# Patient Record
Sex: Male | Born: 1990 | Race: White | Hispanic: No | Marital: Single | Smoking: Never smoker
Health system: Southern US, Community
[De-identification: ages and names within clinical notes are randomized; demographics above are authoritative.]

## PROBLEM LIST (undated history)

## (undated) HISTORY — PX: LUMBAR MICRODISCECTOMY: SHX99

---
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
## 2011-07-21 ENCOUNTER — Ambulatory Visit: Payer: BC Managed Care – PPO | Attending: Family Medicine | Admitting: Sleep Medicine

## 2011-07-21 DIAGNOSIS — G4710 Hypersomnia, unspecified: Secondary | ICD-10-CM | POA: Insufficient documentation

## 2011-07-28 NOTE — Procedures (Signed)
NAMEDONTARIUS, SHELEY NO.:  [id number]  MEDICAL RECORD NO.:  [id number]          PATIENT TYPE:  OUT  LOCATION:  SLEEP LAB                     FACILITY:  [facility]  PHYSICIAN:  Masyn Rostro A. Gerilyn Pilgrim, M.D. DATE OF BIRTH:  10-31-1990  DATE OF STUDY:  07/21/2011                           NOCTURNAL POLYSOMNOGRAM  REFERRING PHYSICIAN:  Scott A. Gerda Diss, MD  INDICATION:  A 21 year old who presents with hypersomnia and snoring. The study is being done to evaluate for obstructive sleep apnea syndrome.   MEDICATIONS:  None.  EPWORTH SLEEPINESS SCALE:  11.  BMI:  29.  ARCHITECTURAL SUMMARY:  The total recording time is 398 minutes.  Sleep efficiency 80%.  Sleep latency 71 minutes.  REM latency is 61 minutes. Stage N1 5%, N2 58%, N3 24% and REM sleep 13%.  RISK FACTORS SUMMARY:  Baseline oxygen saturation is 96, lowest saturation 92.  Diagnostic AHI is 1 and RDI 7.  LIMB MOVEMENT SUMMARY:  PLM index 1.5.  ELECTROCARDIOGRAM SUMMARY:  Average heart rate is 62 with no significant dysrhythmias observed.  IMPRESSION:  Unremarkable nocturnal polysomnography.  RECOMMENDATION:  Given the patient's age and unremarkable study, I would suggest sleep consultation to evaluate for other causes of hypersomnia more common in this age group; in particular to evaluate for narcolepsy and idiopathic hypersomnia.  Thank you for this referral.    Malika Demario A. Gerilyn Pilgrim, M.D.    KAD/MEDQ  D:  07/28/2011 18:07:31  T:  07/28/2011 18:24:49  Job:  [id number]

## 2019-11-23 ENCOUNTER — Encounter (HOSPITAL_COMMUNITY): Payer: Self-pay | Admitting: *Deleted

## 2019-11-23 ENCOUNTER — Other Ambulatory Visit: Payer: Self-pay

## 2019-11-23 ENCOUNTER — Emergency Department (HOSPITAL_COMMUNITY): Payer: BC Managed Care – PPO

## 2019-11-23 ENCOUNTER — Emergency Department (HOSPITAL_COMMUNITY)
Admission: EM | Admit: 2019-11-23 | Discharge: 2019-11-23 | Disposition: A | Discharge status: Home / Self Care | Payer: BC Managed Care – PPO | Attending: Emergency Medicine | Admitting: Emergency Medicine

## 2019-11-23 ENCOUNTER — Ambulatory Visit (INDEPENDENT_AMBULATORY_CARE_PROVIDER_SITE_OTHER)
Admission: EM | Admit: 2019-11-23 | Discharge: 2019-11-23 | Disposition: A | Discharge status: Home / Self Care | Payer: BC Managed Care – PPO | Source: Home / Self Care

## 2019-11-23 DIAGNOSIS — R29810 Facial weakness: Secondary | ICD-10-CM

## 2019-11-23 DIAGNOSIS — R000 Tachycardia, unspecified: Secondary | ICD-10-CM | POA: Diagnosis not present

## 2019-11-23 DIAGNOSIS — R4781 Slurred speech: Secondary | ICD-10-CM | POA: Diagnosis not present

## 2019-11-23 DIAGNOSIS — R29818 Other symptoms and signs involving the nervous system: Secondary | ICD-10-CM | POA: Diagnosis not present

## 2019-11-23 DIAGNOSIS — G510 Bell's palsy: Secondary | ICD-10-CM | POA: Diagnosis not present

## 2019-11-23 DIAGNOSIS — G529 Cranial nerve disorder, unspecified: Secondary | ICD-10-CM | POA: Diagnosis not present

## 2019-11-23 LAB — COMPREHENSIVE METABOLIC PANEL
ALT: 85 U/L — ABNORMAL HIGH (ref 0–44)
AST: 46 U/L — ABNORMAL HIGH (ref 15–41)
Albumin: 4.8 g/dL (ref 3.5–5.0)
Alkaline Phosphatase: 48 U/L (ref 38–126)
Anion gap: 7 (ref 5–15)
BUN: 11 mg/dL (ref 6–20)
CO2: 27 mmol/L (ref 22–32)
Calcium: 9.3 mg/dL (ref 8.9–10.3)
Chloride: 104 mmol/L (ref 98–111)
Creatinine, Ser: 1.23 mg/dL (ref 0.61–1.24)
GFR calc Af Amer: >60 mL/min (ref >60)
GFR calc non Af Amer: >60 mL/min (ref >60)
Glucose, Bld: 94 mg/dL (ref 70–99)
Potassium: 4.2 mmol/L (ref 3.5–5.1)
Sodium: 138 mmol/L (ref 135–145)
Total Bilirubin: 0.6 mg/dL (ref 0.3–1.2)
Total Protein: 7.7 g/dL (ref 6.5–8.1)

## 2019-11-23 LAB — APTT: aPTT: 26 seconds (ref 24–36)

## 2019-11-23 LAB — I-STAT CHEM 8, ED
BUN: 11 mg/dL (ref 6–20)
Calcium, Ion: 1.23 mmol/L (ref 1.15–1.40)
Chloride: 100 mmol/L (ref 98–111)
Creatinine, Ser: 1.3 mg/dL — ABNORMAL HIGH (ref 0.61–1.24)
Glucose, Bld: 92 mg/dL (ref 70–99)
HCT: 47 % (ref 39.0–52.0)
Hemoglobin: 16 g/dL (ref 13.0–17.0)
Potassium: 4.1 mmol/L (ref 3.5–5.1)
Sodium: 142 mmol/L (ref 135–145)
TCO2: 31 mmol/L (ref 22–32)

## 2019-11-23 LAB — CBC
HCT: 46.5 % (ref 39.0–52.0)
Hemoglobin: 15.9 g/dL (ref 13.0–17.0)
MCH: 30.9 pg (ref 26.0–34.0)
MCHC: 34.2 g/dL (ref 30.0–36.0)
MCV: 90.3 fL (ref 80.0–100.0)
Platelets: 239 10*3/uL (ref 150–400)
RBC: 5.15 MIL/uL (ref 4.22–5.81)
RDW: 12.2 % (ref 11.5–15.5)
WBC: 9.8 10*3/uL (ref 4.0–10.5)
nRBC: 0 % (ref 0.0–0.2)

## 2019-11-23 LAB — DIFFERENTIAL
Abs Immature Granulocytes: 0.04 10*3/uL (ref 0.00–0.07)
Basophils Absolute: 0.1 10*3/uL (ref 0.0–0.1)
Basophils Relative: 1 %
Eosinophils Absolute: 0.2 10*3/uL (ref 0.0–0.5)
Eosinophils Relative: 2 %
Immature Granulocytes: 0 %
Lymphocytes Relative: 32 %
Lymphs Abs: 3.2 10*3/uL (ref 0.7–4.0)
Monocytes Absolute: 1.1 10*3/uL — ABNORMAL HIGH (ref 0.1–1.0)
Monocytes Relative: 11 %
Neutro Abs: 5.3 10*3/uL (ref 1.7–7.7)
Neutrophils Relative %: 54 %

## 2019-11-23 LAB — PROTIME-INR
INR: 0.9 (ref 0.8–1.2)
Prothrombin Time: 12.2 seconds (ref 11.4–15.2)

## 2019-11-23 LAB — CBG MONITORING, ED: Glucose-Capillary: 99 mg/dL (ref 70–99)

## 2019-11-23 MED ORDER — SODIUM CHLORIDE 0.9% FLUSH: 3.0000 mL | Freq: Once | INTRAVENOUS | Status: DC

## 2019-11-23 MED ORDER — PREDNISONE 50 MG PO TABS
60.0000 mg | ORAL_TABLET | Freq: Once | ORAL | Status: AC
  Administered 2019-11-23: 60 mg via ORAL
  Filled 2019-11-23: qty 1

## 2019-11-23 MED ORDER — PREDNISONE 50 MG PO TABS
50.0000 mg | ORAL_TABLET | Freq: Every day | ORAL | 0 refills | Status: DC
Start: 2019-11-23 — End: 2021-10-04

## 2019-11-23 MED ORDER — IOHEXOL 350 MG/ML SOLN
100.0000 mL | Freq: Once | INTRAVENOUS | Status: AC | PRN
  Administered 2019-11-23: 100 mL via INTRAVENOUS

## 2019-11-23 NOTE — ED Triage Notes (Signed)
Pt with right facial droop and slurred speech since 0930 this morning, seen at urgent Care and sent here.  Pt denies any numbness or weakness, denies any HA

## 2019-11-23 NOTE — ED Triage Notes (Signed)
Facial droop and slurred speech since 9am.  Advised to go to the ER further workup.

## 2019-11-23 NOTE — Consult Note (Signed)
[facility] Tele[facility]urology Consult Services   Date of Service:   11/23/2019 14:40:13  Impression:     .  I63.9 - Cerebrovascular accident (CVA), unspecified mechanism ([place])     .  G51.0 - Bell palsy  Comments/Sign-Out: Pt is a 41 YOM with PMH oral herpes who presented with right facial weakness (UMN/LMN), and loss of taste on right side. NIHSS: 3. He was outside TPA window. Get CTA to eval for LVO/STENOSIS. Admit for MRI to r/o stroke. If workup negative for stroke then probably bells palsy and can treat with prednisone + acylovir for 7 days.  Metrics: Last Known Well: 11/23/2019 09:30:00 [facility] Notification Time: 11/23/2019 14:40:13 Arrival Time: 11/23/2019 14:12:00 Stamp Time: 11/23/2019 14:40:13 Time First Login Attempt: 11/23/2019 14:41:49 Symptoms: right facial droop NIHSS Start Assessment Time: 11/23/2019 14:45:07 Patient is not a candidate for Thrombolytic. Thrombolytic Medical Decision: 11/23/2019 15:00:11 Patient was not deemed candidate for Thrombolytic because of following reasons: Last Well Known Above 4.5 Hours.  CT head showed no acute hemorrhage or acute core infarct.  Radiologist was not called back for review of advanced imaging because CTA: Suboptimal CTA due to timing of the contrast bolus. Late arterial scanning was performed  Negative CT angiogram of the head neck. No stenosis or large vessel occlusion identified.  ED Physician notified of diagnostic impression and management plan on 11/23/2019 15:07:47  Advanced Imaging: CTA Head and Neck ordered   Our recommendations are outlined below.  Recommendations:     .  Activate Stroke Protocol Admission/Order Set     .  Stroke/Telemetry Floor     .  Neuro Checks     .  Bedside Swallow Eval     .  DVT Prophylaxis     .  IV Fluids, Normal Saline     .  Head of Bed 30 Degrees     .  Euglycemia and Avoid Hyperthermia (PRN Acetaminophen)     .  Monitor neuro checks/VS q4h with  telemetry.     .  Recommend fall precautions precautions.     .  Goal SBP b/w 100-140.     Marland Kitchen  Start ASA + STATIN if no contraindications. Can stop if no acute stroke noted.      .  Get MRI BRAIN W/O and Get ECHO if acute stroke noted.     .  Get ESR/CRP, TROP, CK, TSH, B12, B1, BNP, LACTIC ACID, LIPID PANEL, and A1C.     .  Get WORKUP for TOXIC/METABOLIC/INFECTIOUS causes.     .  If workup negative for stroke then probably bells palsy and can treat with prednisone + acylovir for 7 days.  Routine Consultation with [facility] for Follow up Care  Sign Out:     .  Discussed with Emergency Department Provider    ------------------------------------------------------------------------------  History of Present Illness: Patient is a 29 year old Male.  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 LKN is not clear as symptoms may have started last night or this morning and pt and wife not sure. He had decreased taste on right side of tongue. He smokes 1 pk/day since 29 yo. He will binge drink intermittently.   Past Medical History:     . There is NO history of Hypertension     . There is NO history of Diabetes Mellitus     .  There is NO history of Hyperlipidemia     . There is NO history of Atrial Fibrillation     . There is NO history of Coronary Artery Disease     . There is NO history of Stroke  Social History: Smoking: No Alcohol Use: No Drug Use: No  Anticoagulant use:  No  Antiplatelet use: No    Examination: BP(157/102), Pulse(93), Blood Glucose(99) 1A: Level of Consciousness - Alert; keenly responsive + 0 1B: Ask Month and Age - Both Questions Right + 0 1C: Blink Eyes & Squeeze Hands - Performs Both Tasks + 0 2: Test Horizontal Extraocular Movements - Normal + 0 3: Test Visual Fields - No Visual Loss + 0 4: Test Facial Palsy (Use Grimace if  Obtunded) - Unilateral Complete paralysis (upper/lower face) + 3 5A: Test Left Arm Motor Drift - No Drift for 10 Seconds + 0 5B: Test Right Arm Motor Drift - No Drift for 10 Seconds + 0 6A: Test Left Leg Motor Drift - No Drift for 5 Seconds + 0 6B: Test Right Leg Motor Drift - No Drift for 5 Seconds + 0 7: Test Limb Ataxia (FNF/Heel-Shin) - No Ataxia + 0 8: Test Sensation - Normal; No sensory loss + 0 9: Test Language/Aphasia - Normal; No aphasia + 0 10: Test Dysarthria - Normal + 0 11: Test Extinction/Inattention - No abnormality + 0  NIHSS Score: 3  Pre-Morbid Modified Rankin Scale: 0 Points = No symptoms at all   Patient/Family was informed the Neurology Consult would occur via TeleHealth consult by way of interactive audio and video telecommunications and consented to receiving care in this manner.   Patient is being evaluated for possible acute neurologic impairment and high probability of imminent or life-threatening deterioration. I spent total of 35 minutes providing care to this patient, including time for face to face visit via telemedicine, review of medical records, imaging studies and discussion of findings with providers, the patient and/or family.   Dr Currie Paris   [facility] [phone]  Case [id number]

## 2019-11-23 NOTE — Discharge Instructions (Signed)
Recommending further evaluation and management in the ED.  Cannot rule out stroke in urgent care setting.  Recommended EMS transport.  Patient declines.  Will have wife take him to [facility] ED by private vehicle.

## 2019-11-23 NOTE — ED Provider Notes (Signed)
  [facility]   [id number] 11/23/19 Arrival Time: 1344  Abbreviated note CC: Slurred speech and facial droop  SUBJECTIVE:  Albert Grimes is a 29 y.o. male who complains of LT sided facial droop and slurred speech since 9:30 this morning.  Denies a precipitating event or head trauma.  Deneis similar symptoms.  Patient denies fever, chills, nausea, weakness, chest pain, SOB.     ROS: As per HPI.  All other pertinent ROS negative.     OBJECTIVE:  Vitals:   11/23/19 1354  BP: (!) 143/83  Pulse: 73  Resp: 16  Temp: 97.8 F (36.6 C)  TempSrc: Oral  SpO2: 96%    General appearance: alert; no distress Eyes: PERRLA; EOMI HENT: normocephalic; atraumatic Neck: supple with FROM Lungs: clear to auscultation bilaterally Heart: regular rate and rhythm.   Skin: warm and dry Neurologic: RT sided facial droop, able to raise RT eyebrow; finger to nose without difficulty; normal gait; strength and sensation intact bilaterally about the upper and lower extremities; negative pronator drift Psychological: alert and cooperative; normal mood and affect   ASSESSMENT & PLAN:  1. Slurred speech   2. Facial droop    Recommending further evaluation and management in the ED.  Cannot rule out stroke in urgent care setting.  Recommended EMS transport.  Patient declines.  Will have wife take him to [facility] ED by private vehicle.     Rennis Harding, PA-C 11/23/19 1407

## 2019-11-23 NOTE — ED Provider Notes (Signed)
[facility] EMERGENCY DEPARTMENT Provider Note   CSN: [id number] Arrival date & time: 11/23/19  1412     History Chief Complaint  Patient presents with  . Aphasia    Albert Grimes is a 29 y.o. male.  HPI   28yM with R facial weakness. Symptom onset yesterday evening. Noticed difficulty spitting and coming out the R side of his mouth. Had some cold sore come up on his lips in the past 2 days and thought it may be related to this. This morning got up and noticed R eye felt dry and twitching. While at work people commented that his voice sounded different. When drinking Dr Reino KentPepper he noticed that it didn't taste right. Went to UC to be evaluated and then referred to the ED. Denies any other acute symptoms. No pain. No visual complaints. No other numbness, tingling, loss of strength or difficulty with balance or coordination.   History reviewed. No pertinent past medical history.  Patient Active Problem List   Diagnosis Date Noted  . SHOULDER INSTABILITY 12/13/2008  . SHOULDER PAIN 12/13/2008  . IMPINGEMENT SYNDROME 12/13/2008    History reviewed. No pertinent surgical history.     History reviewed. No pertinent family history.  Social History   Tobacco Use  . Smoking status: Never Smoker  . Smokeless tobacco: Never Used  Substance Use Topics  . Alcohol use: No  . Drug use: No    Home Medications Prior to Admission medications   Medication Sig Start Date End Date Taking? Authorizing Provider  acyclovir (ZOVIRAX) 800 MG tablet Take 800 mg by mouth 2 (two) times daily.   Yes [provider]    Allergies    Scallops [shellfish allergy]  Review of Systems   Review of Systems All systems reviewed and negative, other than as noted in HPI.  Physical Exam Updated Vital Signs BP (!) 151/91 (BP Location: Left Arm)   Pulse 97   Temp 98 F (36.7 C) (Oral)   Resp (!) 23   Ht 6\' 6"  (1.981 m)   Wt 124.7 kg   SpO2 100%   BMI 31.78 kg/m   Physical  Exam Vitals and nursing note reviewed.  Constitutional:      General: He is not in acute distress.    Appearance: He is well-developed.  HENT:     Head: Normocephalic and atraumatic.  Eyes:     General:        Right eye: No discharge.        Left eye: No discharge.     Conjunctiva/sclera: Conjunctivae normal.  Cardiovascular:     Rate and Rhythm: Normal rate and regular rhythm.     Heart sounds: Normal heart sounds. No murmur heard.  No friction rub. No gallop.   Pulmonary:     Effort: Pulmonary effort is normal. No respiratory distress.     Breath sounds: Normal breath sounds.  Abdominal:     General: There is no distension.     Palpations: Abdomen is soft.     Tenderness: There is no abdominal tenderness.  Musculoskeletal:        General: No tenderness.     Cervical back: Neck supple.  Skin:    General: Skin is warm and dry.  Neurological:     Mental Status: He is alert.     Comments: R 7th CN palsy   Psychiatric:        Behavior: Behavior normal.        Thought Content:  Thought content normal.     ED Results / Procedures / Treatments   Labs (all labs ordered are listed, but only abnormal results are displayed) Labs Reviewed  DIFFERENTIAL - Abnormal; Notable for the following components:      Result Value   Monocytes Absolute 1.1 (*)    All other components within normal limits  COMPREHENSIVE METABOLIC PANEL - Abnormal; Notable for the following components:   AST 46 (*)    ALT 85 (*)    All other components within normal limits  I-STAT CHEM 8, ED - Abnormal; Notable for the following components:   Creatinine, Ser 1.30 (*)    All other components within normal limits  PROTIME-INR  APTT  CBC  CBG MONITORING, ED  CBG MONITORING, ED    EKG EKG Interpretation  Date/Time:  Tuesday November 23 2019 14:20:32 EDT Ventricular Rate:  100 PR Interval:    QRS Duration: 106 QT Interval:  325 QTC Calculation: 420 R Axis:   -46 Text Interpretation: Sinus tachycardia  Left axis deviation ST elev, probable normal early repol pattern Confirmed by Raeford Razor [phone]) on 11/23/2019 3:27:36 PM   Radiology No results found.   MR BRAIN WO CONTRAST  Result Date: 11/23/2019 CLINICAL DATA:  Cranial neuropathy (cranial nerve 7) EXAM: MRI HEAD WITHOUT CONTRAST TECHNIQUE: Multiplanar, multiecho pulse sequences of the brain and surrounding structures were obtained without intravenous contrast. COMPARISON:  Same day noncontrast head CT and CTA head and neck. FINDINGS: Brain: No focal parenchymal signal abnormality. No acute infarct or intracranial hemorrhage. No midline shift, ventriculomegaly or extra-axial fluid collection. No mass lesion. Vascular: Normal flow voids. Skull and upper cervical spine: Normal marrow signal. Sinuses/Orbits: Normal orbits. Clear paranasal sinuses. No mastoid effusion. Other: None. IMPRESSION: No acute intracranial process.  Normal noncontrast MRI of the brain. If suspicion persist consider dedicated MRI cranial nerve 7/8 protocol with contrast (if patient can tolerate). Electronically Signed   By: Stana Bunting M.D.   On: 11/23/2019 17:15   CT HEAD CODE STROKE WO CONTRAST  Result Date: 11/23/2019 CLINICAL DATA:  Code stroke.  Acute neuro deficit.  Slurred speech EXAM: CT HEAD WITHOUT CONTRAST TECHNIQUE: Contiguous axial images were obtained from the base of the skull through the vertex without intravenous contrast. COMPARISON:  CT head 12/24/2010 FINDINGS: Brain: No evidence of acute infarction, hemorrhage, hydrocephalus, extra-axial collection or mass lesion/mass effect. Vascular: Negative for hyperdense vessel Skull: Negative Sinuses/Orbits: Paranasal sinuses clear.  Negative orbit Other: None ASPECTS (Alberta Stroke Program Early CT Score) - Ganglionic level infarction (caudate, lentiform nuclei, internal capsule, insula, M1-M3 cortex): 7 - Supraganglionic infarction (M4-M6 cortex): 3 Total score (0-10 with 10 being normal): 10 IMPRESSION:  1. Normal CT head 2. ASPECTS is 10 3. These results were called by telephone at the time of interpretation on 11/23/2019 at 2:50 pm to provider [facility] ZAMMIT , who verbally acknowledged these results. Electronically Signed   By: Marlan Palau M.D.   On: 11/23/2019 14:51   CT ANGIO HEAD CODE STROKE  Result Date: 11/23/2019 CLINICAL DATA:  Right facial weakness. Rule out stroke. Slurred speech. EXAM: CT ANGIOGRAPHY HEAD AND NECK TECHNIQUE: Multidetector CT imaging of the head and neck was performed using the standard protocol during bolus administration of intravenous contrast. Multiplanar CT image reconstructions and MIPs were obtained to evaluate the vascular anatomy. Carotid stenosis measurements (when applicable) are obtained utilizing NASCET criteria, using the distal internal carotid diameter as the denominator. CONTRAST:  OMNIPAQUE IOHEXOL 350 MG/ML SOLN COMPARISON:  CT head 11/23/2019 FINDINGS: CTA NECK FINDINGS Aortic arch: Standard branching. Imaged portion shows no evidence of aneurysm or dissection. No significant stenosis of the major arch vessel origins. Right carotid system: Normal right carotid. Negative for atherosclerotic disease, stenosis, or dissection Left carotid system: Normal left carotid. Negative for atherosclerotic disease, stenosis or dissection. Vertebral arteries: Normal vertebral arteries bilaterally. Both vertebral arteries patent to the basilar. Skeleton: Negative Other neck: Negative Upper chest: Lung apices clear bilaterally. Superior mediastinum intact. Review of the MIP images confirms the above findings CTA HEAD FINDINGS Anterior circulation: Suboptimal intracranial evaluation. Contrast bolus is in the late arterial phase. No large vessel occlusion. Posterior circulation: Suboptimal intracranial arterial evaluation. Both vertebral arteries patent to the basilar. Valve basilar patent. Posterior cerebral arteries patent bilaterally. No large vessel occlusion. Venous  sinuses: Normal venous enhancement Anatomic variants: None Review of the MIP images confirms the above findings IMPRESSION: 1. Suboptimal CTA due to timing of the contrast bolus. Late arterial scanning was performed 2. Negative CT angiogram of the head neck. No stenosis or large vessel occlusion identified. Electronically Signed   By: Marlan Palau M.D.   On: 11/23/2019 15:39   CT ANGIO NECK CODE STROKE  Result Date: 11/23/2019 CLINICAL DATA:  Right facial weakness. Rule out stroke. Slurred speech. EXAM: CT ANGIOGRAPHY HEAD AND NECK TECHNIQUE: Multidetector CT imaging of the head and neck was performed using the standard protocol during bolus administration of intravenous contrast. Multiplanar CT image reconstructions and MIPs were obtained to evaluate the vascular anatomy. Carotid stenosis measurements (when applicable) are obtained utilizing NASCET criteria, using the distal internal carotid diameter as the denominator. CONTRAST:  OMNIPAQUE IOHEXOL 350 MG/ML SOLN COMPARISON:  CT head 11/23/2019 FINDINGS: CTA NECK FINDINGS Aortic arch: Standard branching. Imaged portion shows no evidence of aneurysm or dissection. No significant stenosis of the major arch vessel origins. Right carotid system: Normal right carotid. Negative for atherosclerotic disease, stenosis, or dissection Left carotid system: Normal left carotid. Negative for atherosclerotic disease, stenosis or dissection. Vertebral arteries: Normal vertebral arteries bilaterally. Both vertebral arteries patent to the basilar. Skeleton: Negative Other neck: Negative Upper chest: Lung apices clear bilaterally. Superior mediastinum intact. Review of the MIP images confirms the above findings CTA HEAD FINDINGS Anterior circulation: Suboptimal intracranial evaluation. Contrast bolus is in the late arterial phase. No large vessel occlusion. Posterior circulation: Suboptimal intracranial arterial evaluation. Both vertebral arteries patent to the basilar.  Valve basilar patent. Posterior cerebral arteries patent bilaterally. No large vessel occlusion. Venous sinuses: Normal venous enhancement Anatomic variants: None Review of the MIP images confirms the above findings IMPRESSION: 1. Suboptimal CTA due to timing of the contrast bolus. Late arterial scanning was performed 2. Negative CT angiogram of the head neck. No stenosis or large vessel occlusion identified. Electronically Signed   By: Marlan Palau M.D.   On: 11/23/2019 15:39    Procedures Procedures (including critical care time)  Medications Ordered in ED Medications  sodium chloride flush (NS) 0.9 % injection 3 mL (has no administration in time range)  iohexol (OMNIPAQUE) 350 MG/ML injection 100 mL (100 mLs Intravenous Contrast Given 11/23/19 1523)    ED Course  I have reviewed the triage vital signs and the nursing notes.  Pertinent labs & imaging results that were available during my care of the patient were reviewed by me and considered in my medical decision making (see chart for details).    MDM Rules/Calculators/A&P  Right 7th nerve palsy. Imaging ok. Acyclovir.  Steroids.  Outpatient follow-up.    Final Clinical Impression(s) / ED Diagnoses Final diagnoses:  Facial nerve palsy    Rx / DC Orders ED Discharge Orders    None       Raeford Razor, MD 11/28/19 1756

## 2019-11-23 NOTE — ED Notes (Addendum)
CODE STROKE PAGED @ [phone]

## 2020-10-18 ENCOUNTER — Other Ambulatory Visit: Payer: Self-pay | Admitting: Chiropractor

## 2020-10-18 ENCOUNTER — Ambulatory Visit
Admission: RE | Admit: 2020-10-18 | Discharge: 2020-10-18 | Disposition: A | Discharge status: Home / Self Care | Payer: Self-pay | Source: Ambulatory Visit | Attending: Chiropractor | Admitting: Chiropractor

## 2020-10-18 ENCOUNTER — Other Ambulatory Visit: Payer: Self-pay

## 2020-10-18 DIAGNOSIS — S233XXA Sprain of ligaments of thoracic spine, initial encounter: Secondary | ICD-10-CM

## 2020-10-18 DIAGNOSIS — S338XXA Sprain of other parts of lumbar spine and pelvis, initial encounter: Secondary | ICD-10-CM

## 2021-06-09 IMAGING — CT CT ANGIO HEAD-NECK
2 of 9 series · 7 of 33 positions shown · IV contrast (Omnipaque or Isovue)
Comparison: CT head 11/23/2019

CLINICAL DATA: Right facial weakness. Rule out stroke. Slurred
speech.

EXAM:
CT ANGIOGRAPHY HEAD AND NECK
TECHNIQUE: Multidetector CT imaging of the head and neck was performed using
the standard protocol during bolus administration of intravenous
contrast. Multiplanar CT image reconstructions and MIPs were
obtained to evaluate the vascular anatomy. Carotid stenosis
measurements (when applicable) are obtained utilizing NASCET
criteria, using the distal internal carotid diameter as the
denominator.
CONTRAST:  100mL OMNIPAQUE IOHEXOL 350 MG/ML SOLN

[Series 6: cta head & neck · axial · 0.44mm/px · z∈[-131,+126]mm · 5 of 773 slices shown]
[im 129/773  soft-tissue]
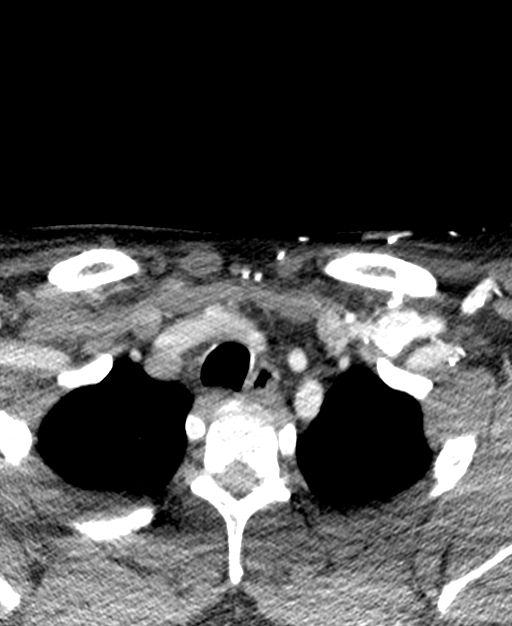
[im 258/773  bone]
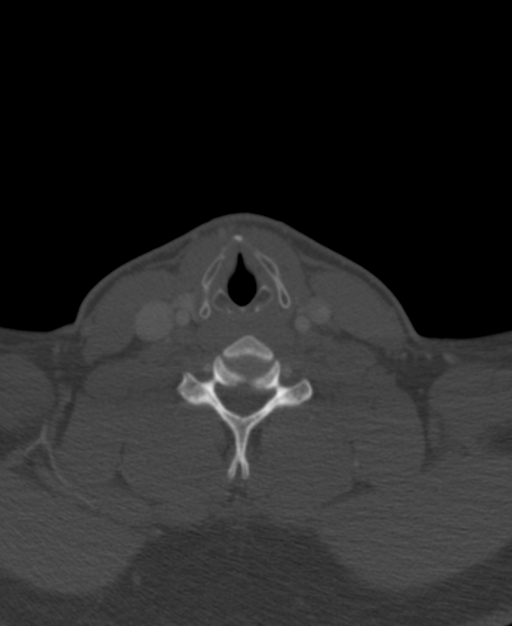
[im 387/773  soft-tissue]
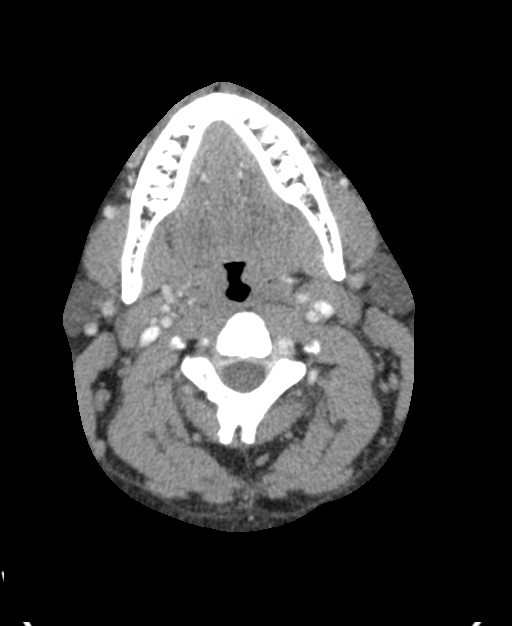
[im 515/773  bone]
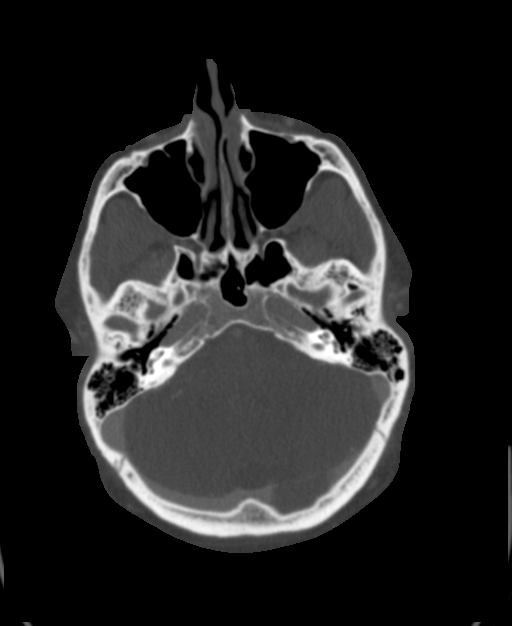
[im 644/773  soft-tissue]
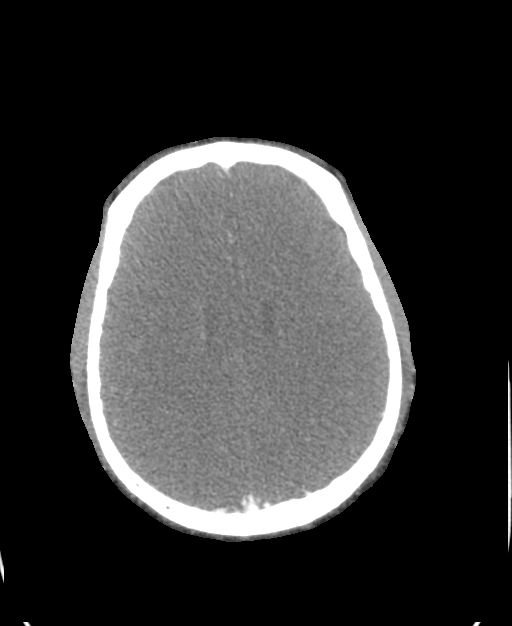

[Series 7: ax thins · axial · 0.44mm/px · z∈[-67,+62]mm · 2 of 387 slices shown]
[im 129/387  soft-tissue]
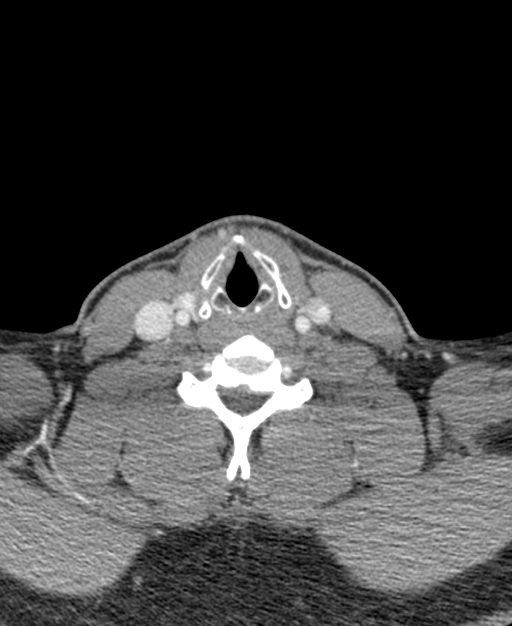
[im 258/387  soft-tissue]
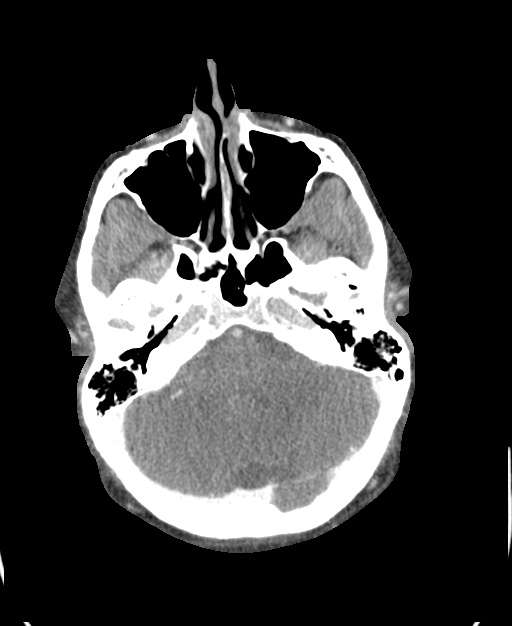

[7 of 33 positions shown; findings below may reference images not displayed]

FINDINGS: CTA NECK FINDINGS

Aortic arch: Standard branching. Imaged portion shows no evidence of
aneurysm or dissection. No significant stenosis of the major arch
vessel origins.

Right carotid system: Normal right carotid. Negative for
atherosclerotic disease, stenosis, or dissection

Left carotid system: Normal left carotid. Negative for
atherosclerotic disease, stenosis or dissection.

Vertebral arteries: Normal vertebral arteries bilaterally. Both
vertebral arteries patent to the basilar.

Skeleton: Negative

Other neck: Negative

Upper chest: Lung apices clear bilaterally. Superior mediastinum
intact.

Review of the MIP images confirms the above findings

CTA HEAD FINDINGS

Anterior circulation: Suboptimal intracranial evaluation. Contrast
bolus is in the late arterial phase. No large vessel occlusion.

Posterior circulation: Suboptimal intracranial arterial evaluation.
Both vertebral arteries patent to the basilar. Valve basilar patent.
Posterior cerebral arteries patent bilaterally. No large vessel
occlusion.

Venous sinuses: Normal venous enhancement

Anatomic variants: None

Review of the MIP images confirms the above findings
IMPRESSION: 1. Suboptimal CTA due to timing of the contrast bolus. Late arterial
scanning was performed
2. Negative CT angiogram of the head neck. No stenosis or large
vessel occlusion identified.

## 2021-07-23 DIAGNOSIS — M9903 Segmental and somatic dysfunction of lumbar region: Secondary | ICD-10-CM | POA: Diagnosis not present

## 2021-07-23 DIAGNOSIS — M5432 Sciatica, left side: Secondary | ICD-10-CM | POA: Diagnosis not present

## 2021-07-23 DIAGNOSIS — M62830 Muscle spasm of back: Secondary | ICD-10-CM | POA: Diagnosis not present

## 2021-07-23 DIAGNOSIS — M545 Low back pain, unspecified: Secondary | ICD-10-CM | POA: Diagnosis not present

## 2021-07-25 DIAGNOSIS — S335XXA Sprain of ligaments of lumbar spine, initial encounter: Secondary | ICD-10-CM | POA: Diagnosis not present

## 2021-07-25 DIAGNOSIS — M9903 Segmental and somatic dysfunction of lumbar region: Secondary | ICD-10-CM | POA: Diagnosis not present

## 2021-07-25 DIAGNOSIS — M62830 Muscle spasm of back: Secondary | ICD-10-CM | POA: Diagnosis not present

## 2021-07-25 DIAGNOSIS — M545 Low back pain, unspecified: Secondary | ICD-10-CM | POA: Diagnosis not present

## 2021-07-27 DIAGNOSIS — M545 Low back pain, unspecified: Secondary | ICD-10-CM | POA: Diagnosis not present

## 2021-07-27 DIAGNOSIS — S335XXA Sprain of ligaments of lumbar spine, initial encounter: Secondary | ICD-10-CM | POA: Diagnosis not present

## 2021-07-27 DIAGNOSIS — M9903 Segmental and somatic dysfunction of lumbar region: Secondary | ICD-10-CM | POA: Diagnosis not present

## 2021-07-27 DIAGNOSIS — M62830 Muscle spasm of back: Secondary | ICD-10-CM | POA: Diagnosis not present

## 2021-08-03 DIAGNOSIS — S335XXA Sprain of ligaments of lumbar spine, initial encounter: Secondary | ICD-10-CM | POA: Diagnosis not present

## 2021-08-03 DIAGNOSIS — M62830 Muscle spasm of back: Secondary | ICD-10-CM | POA: Diagnosis not present

## 2021-08-03 DIAGNOSIS — M9903 Segmental and somatic dysfunction of lumbar region: Secondary | ICD-10-CM | POA: Diagnosis not present

## 2021-08-03 DIAGNOSIS — M545 Low back pain, unspecified: Secondary | ICD-10-CM | POA: Diagnosis not present

## 2021-08-08 DIAGNOSIS — S335XXA Sprain of ligaments of lumbar spine, initial encounter: Secondary | ICD-10-CM | POA: Diagnosis not present

## 2021-08-08 DIAGNOSIS — M545 Low back pain, unspecified: Secondary | ICD-10-CM | POA: Diagnosis not present

## 2021-08-08 DIAGNOSIS — M9903 Segmental and somatic dysfunction of lumbar region: Secondary | ICD-10-CM | POA: Diagnosis not present

## 2021-08-08 DIAGNOSIS — M62830 Muscle spasm of back: Secondary | ICD-10-CM | POA: Diagnosis not present

## 2021-08-15 DIAGNOSIS — S335XXA Sprain of ligaments of lumbar spine, initial encounter: Secondary | ICD-10-CM | POA: Diagnosis not present

## 2021-08-15 DIAGNOSIS — M62830 Muscle spasm of back: Secondary | ICD-10-CM | POA: Diagnosis not present

## 2021-08-15 DIAGNOSIS — M545 Low back pain, unspecified: Secondary | ICD-10-CM | POA: Diagnosis not present

## 2021-08-15 DIAGNOSIS — M9903 Segmental and somatic dysfunction of lumbar region: Secondary | ICD-10-CM | POA: Diagnosis not present

## 2021-10-04 ENCOUNTER — Ambulatory Visit: Payer: BC Managed Care – PPO | Admitting: Family Medicine

## 2021-10-04 VITALS — BP 132/89 | HR 87 | Temp 97.9°F | Wt 272.8 lb

## 2021-10-04 DIAGNOSIS — M5432 Sciatica, left side: Secondary | ICD-10-CM

## 2021-10-04 MED ORDER — HYDROCODONE-ACETAMINOPHEN 10-325 MG PO TABS: ORAL_TABLET | ORAL | 0 refills | Status: AC

## 2021-10-04 MED ORDER — GABAPENTIN 100 MG PO CAPS: 100.0000 mg | ORAL_CAPSULE | Freq: Three times a day (TID) | ORAL | 3 refills | Status: DC
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
## 2021-10-04 NOTE — Patient Instructions (Signed)
We will be working on setting up the MRI as quickly as possible  As for the gabapentin, 100 mg 1 each evening on Thursday night and Friday night On Saturday 100 mg twice daily 2 twice daily for Saturday, Sunday, Monday Then one taken  3 times daily  If the medication causes drowsiness reduce the dose Call us if any problems  Also the hydrocodone is meant for evening use caution drowsiness Do not take medication and drive in regards to the hydrocodone  Follow-up is in 4 weeks  We are working on setting you up with neurosurgery as quickly as possible but they will want the MRI done first

## 2021-10-05 ENCOUNTER — Ambulatory Visit: Payer: Self-pay | Admitting: Family Medicine

## 2021-10-09 ENCOUNTER — Telehealth: Payer: Self-pay | Admitting: *Deleted

## 2021-10-09 NOTE — Telephone Encounter (Signed)
Nurses I tried to call the number that was on the message that [facility] sent previously [phone] Case #[id number] In regards to his MRI It stated that this number was out of service Please see what you can do to find out a number that I need to utilize for peer to peer consult to try to get his MRI approved thank you

## 2021-10-09 NOTE — Telephone Encounter (Signed)
Patient left message on machine stating he was following up on MRI and referral to neurosurgeon. Patient states the insurance sent him a letter stating the doctor needed to call them to get the MRI approved and he has to have the MRI to make an appt with neurosurgeon  Please advise   [phone]

## 2021-10-09 NOTE — Telephone Encounter (Signed)
Message sent to referral coordinator

## 2021-10-10 ENCOUNTER — Telehealth: Payer: Self-pay | Admitting: Family Medicine

## 2021-10-10 NOTE — Telephone Encounter (Signed)
Nurses It should be noted that the patient called wondering when he would have his MRI and his consultation with neurosurgery Previously when I saw the patient he was having significant sciatica. We ordered an MRI We are having difficulty getting that approved because of the following  The insurance company stated that they would not approve the MRI unless he has had at least 12 weeks of conservative management before they would approve the MRI.  This patient was under the care of a chiropractor for several months.  But insurance companies does not consider that conservative management.  Typically they consider the 12 weeks will start when a person is seen by their family doctor.  Unfortunately the care given by the chiropractor for those several months is not considered part of this 12 weeks according to the insurance company.  So therefore it is important to let the patient know what is going on and explain the above to the patient please  We are trying to do a peer to peer discussion with the insurance company to see if they would allow for him to have the MRI sooner.  There is a possibility that they will not budge on this.  Unfortunately I do not make these rules.  In the meantime I definitely recommend the patient to continue to do his stretching.  Also to do the follow-up at the end of June as planned  If we are able to get this approved sooner we will inform him.  If it gets denied we will also inform him.

## 2021-10-10 NOTE — Telephone Encounter (Signed)
Pt called back and states that he spoke with Carelon (independent agent for PA for [facility]). They state that PCP office needs to call [phone] to speak with a rep. Pt states he didn't know if Dr.Scott could change up the note to include that home exercises and stretching is not working. Pt states if PCP needed to speak with him, he would be free to talk. Please advise. Thank you

## 2021-10-10 NOTE — Telephone Encounter (Signed)
Patient informed of md message.  Patient states he is going to call the insurance company himself and give them a piece of his mind regarding the MRI.  He says he will call back with an update on what the finds out.

## 2021-10-11 ENCOUNTER — Ambulatory Visit (HOSPITAL_COMMUNITY)
Admission: RE | Admit: 2021-10-11 | Discharge: 2021-10-11 | Disposition: A | Discharge status: Home / Self Care | Payer: BC Managed Care – PPO | Source: Ambulatory Visit | Attending: Family Medicine | Admitting: Family Medicine

## 2021-10-11 DIAGNOSIS — M5432 Sciatica, left side: Secondary | ICD-10-CM | POA: Diagnosis not present

## 2021-10-11 DIAGNOSIS — M79605 Pain in left leg: Secondary | ICD-10-CM | POA: Diagnosis not present

## 2021-10-11 DIAGNOSIS — M545 Low back pain, unspecified: Secondary | ICD-10-CM | POA: Diagnosis not present

## 2021-10-11 NOTE — Telephone Encounter (Signed)
Nurses I did a peer to peer review with a physician at the insurance company They gave authorization for MRI lumbar spine It is good through July 30 The number for this approval is as follows [id number] Please move forward with scheduling MRI Please let patient know that we did do peer-to-peer review and was able to get it approved After discussion with peer to peer they agreed that his care under the chiropractor over the past several months qualifies for conservative management

## 2021-10-11 NOTE — Telephone Encounter (Signed)
See other message 10/10/21 with new number

## 2021-10-11 NOTE — Telephone Encounter (Signed)
Nurses I am willing to try to call this number but if I get put on indefinite hold I may have to pass this initial call to you all It would be beneficial to have his insurance information etc. available so when I call I can give the proper information they are asking for  Any help on doing all of this would be appreciated thank you

## 2021-10-11 NOTE — Telephone Encounter (Signed)
See MRI results  

## 2021-10-11 NOTE — Addendum Note (Signed)
Addended by: Margaretha Sheffield on: 10/11/2021 01:58 PM   Modules accepted: Orders

## 2021-10-11 NOTE — Telephone Encounter (Signed)
MRI at [facility] is going to work patient in today- patient advised to head to [facility] and he would be worked in today. Patient stated he is heading there now.

## 2021-10-11 NOTE — Telephone Encounter (Signed)
Please be aware that if MRI result comes back later today I can handle this But if MRI result comes back on Friday I will be unavailable due to prior scheduled time away I would recommend at that point MRI to be reviewed by Dr. Adriana Simas I also recommend consultation with neurosurgery but they will not move forward with appointment until he has MRI Obviously if MRI shows severe issues he may need more urgent referral to neurosurgery  Nurses-please check MRI on Friday for results.  If it has not been handled by myself please have Dr. Adriana Simas handle it in my absence with the above guidance thank you

## 2021-10-18 ENCOUNTER — Encounter: Payer: Self-pay | Admitting: Family Medicine

## 2021-10-18 MED ORDER — GABAPENTIN 300 MG PO CAPS: 300.0000 mg | ORAL_CAPSULE | Freq: Three times a day (TID) | ORAL | 3 refills | Status: AC

## 2021-10-18 NOTE — Telephone Encounter (Signed)
Patient states he is taking Gabapentin 200 mg three times per day and this is not helping. Advised pt that Dr.Scott says the Gabapentin can be increased to 300 mg three times per day. Informed pt that Dr. Lorin Picket is out of office until next week. Pt verbalized understanding.

## 2021-10-18 NOTE — Telephone Encounter (Signed)
Nurses Due to being out of the office and poor connectivity I will not be able to address further issues until I am back next week  Gabapentin for Albert Grimes can be increased to 300 mg 1 taken 3 times daily  If he is currently doing 200 mg 3 times daily and not seeing response I would recommend 300 mg capsules 1 taken 3 times daily  If he is only utilizing the medication once per day he will need to gradually increase to 200 mg 3 times daily  Once again I will not be able to address this issue until back next week and will not be able to call patient this week due to I am out of the office

## 2021-10-18 NOTE — Telephone Encounter (Signed)
Medication sent to pharmacy  

## 2021-10-22 DIAGNOSIS — M5416 Radiculopathy, lumbar region: Secondary | ICD-10-CM | POA: Diagnosis not present

## 2021-10-22 DIAGNOSIS — M5126 Other intervertebral disc displacement, lumbar region: Secondary | ICD-10-CM | POA: Diagnosis not present

## 2021-10-22 DIAGNOSIS — Z6831 Body mass index (BMI) 31.0-31.9, adult: Secondary | ICD-10-CM | POA: Diagnosis not present

## 2021-10-25 DIAGNOSIS — M5126 Other intervertebral disc displacement, lumbar region: Secondary | ICD-10-CM | POA: Diagnosis not present

## 2021-10-25 DIAGNOSIS — M5116 Intervertebral disc disorders with radiculopathy, lumbar region: Secondary | ICD-10-CM | POA: Diagnosis not present

## 2021-11-01 ENCOUNTER — Ambulatory Visit: Payer: Self-pay | Admitting: Family Medicine

## 2022-03-04 DIAGNOSIS — M4726 Other spondylosis with radiculopathy, lumbar region: Secondary | ICD-10-CM | POA: Diagnosis not present

## 2022-03-11 DIAGNOSIS — M4726 Other spondylosis with radiculopathy, lumbar region: Secondary | ICD-10-CM | POA: Diagnosis not present

## 2022-03-18 DIAGNOSIS — M4726 Other spondylosis with radiculopathy, lumbar region: Secondary | ICD-10-CM | POA: Diagnosis not present

## 2022-03-25 DIAGNOSIS — M4726 Other spondylosis with radiculopathy, lumbar region: Secondary | ICD-10-CM | POA: Diagnosis not present

## 2022-04-01 DIAGNOSIS — M4726 Other spondylosis with radiculopathy, lumbar region: Secondary | ICD-10-CM | POA: Diagnosis not present

## 2022-04-15 ENCOUNTER — Other Ambulatory Visit (HOSPITAL_COMMUNITY): Payer: Self-pay | Admitting: Neurological Surgery

## 2022-04-15 DIAGNOSIS — Z6832 Body mass index (BMI) 32.0-32.9, adult: Secondary | ICD-10-CM | POA: Diagnosis not present

## 2022-04-15 DIAGNOSIS — M5416 Radiculopathy, lumbar region: Secondary | ICD-10-CM

## 2022-04-15 DIAGNOSIS — M5126 Other intervertebral disc displacement, lumbar region: Secondary | ICD-10-CM | POA: Diagnosis not present

## 2022-05-27 DIAGNOSIS — M5126 Other intervertebral disc displacement, lumbar region: Secondary | ICD-10-CM | POA: Diagnosis not present

## 2022-10-10 ENCOUNTER — Other Ambulatory Visit (HOSPITAL_COMMUNITY): Payer: Self-pay | Admitting: Surgery

## 2022-10-10 DIAGNOSIS — M5416 Radiculopathy, lumbar region: Secondary | ICD-10-CM

## 2022-10-14 ENCOUNTER — Other Ambulatory Visit (HOSPITAL_COMMUNITY): Payer: Self-pay | Admitting: Surgery

## 2022-10-14 DIAGNOSIS — M5416 Radiculopathy, lumbar region: Secondary | ICD-10-CM

## 2022-10-25 ENCOUNTER — Ambulatory Visit (HOSPITAL_BASED_OUTPATIENT_CLINIC_OR_DEPARTMENT_OTHER)
Admission: RE | Admit: 2022-10-25 | Discharge: 2022-10-25 | Disposition: A | Discharge status: Home / Self Care | Payer: No Typology Code available for payment source | Source: Ambulatory Visit | Attending: Surgery | Admitting: Surgery

## 2022-10-25 ENCOUNTER — Encounter (HOSPITAL_BASED_OUTPATIENT_CLINIC_OR_DEPARTMENT_OTHER): Payer: Self-pay

## 2022-10-25 DIAGNOSIS — M5416 Radiculopathy, lumbar region: Secondary | ICD-10-CM | POA: Diagnosis not present

## 2022-10-25 MED ORDER — GADOBUTROL 1 MMOL/ML IV SOLN
10.0000 mL | Freq: Once | INTRAVENOUS | Status: AC | PRN
  Administered 2022-10-25: 10 mL via INTRAVENOUS
  Filled 2022-10-25: qty 10
